# Patient Record
Sex: Female | Born: 1985 | Race: White | Hispanic: No | Marital: Single
Health system: Southern US, Community
[De-identification: ages and names within clinical notes are randomized; demographics above are authoritative.]

## PROBLEM LIST (undated history)

## (undated) ENCOUNTER — Inpatient Hospital Stay (HOSPITAL_COMMUNITY): Payer: Self-pay

---

## 2005-04-23 ENCOUNTER — Ambulatory Visit: Payer: Self-pay | Admitting: Family Medicine

## 2005-06-02 ENCOUNTER — Ambulatory Visit: Payer: Self-pay | Admitting: Family Medicine

## 2005-09-04 ENCOUNTER — Observation Stay: Payer: Self-pay | Admitting: Obstetrics and Gynecology

## 2005-09-05 ENCOUNTER — Observation Stay: Payer: Self-pay | Admitting: Certified Nurse Midwife

## 2005-10-19 ENCOUNTER — Inpatient Hospital Stay: Payer: Self-pay | Admitting: Certified Nurse Midwife

## 2010-08-30 ENCOUNTER — Ambulatory Visit: Payer: Self-pay | Admitting: Family

## 2010-10-04 ENCOUNTER — Ambulatory Visit: Payer: Self-pay | Admitting: Family

## 2010-11-25 ENCOUNTER — Ambulatory Visit: Payer: Self-pay | Admitting: Family

## 2010-12-06 ENCOUNTER — Inpatient Hospital Stay: Payer: Self-pay

## 2010-12-11 LAB — PATHOLOGY REPORT

## 2013-01-19 ENCOUNTER — Emergency Department: Payer: Self-pay

## 2013-01-19 LAB — BASIC METABOLIC PANEL
Anion Gap: 10 (ref 7–16)
BUN: 14 mg/dL (ref 7–18)
Calcium, Total: 9.2 mg/dL (ref 8.5–10.1)
Chloride: 108 mmol/L — ABNORMAL HIGH (ref 98–107)
Co2: 23 mmol/L (ref 21–32)
Glucose: 90 mg/dL (ref 65–99)
Osmolality: 281 (ref 275–301)
Potassium: 4.3 mmol/L (ref 3.5–5.1)

## 2013-01-19 LAB — CBC
HCT: 40.9 % (ref 35.0–47.0)
HGB: 13.8 g/dL (ref 12.0–16.0)
MCH: 29.8 pg (ref 26.0–34.0)
MCHC: 33.8 g/dL (ref 32.0–36.0)
Platelet: 192 10*3/uL (ref 150–440)
RBC: 4.63 10*6/uL (ref 3.80–5.20)
WBC: 12.5 10*3/uL — ABNORMAL HIGH (ref 3.6–11.0)

## 2013-01-19 LAB — URINALYSIS, COMPLETE
Bilirubin,UR: NEGATIVE
Ketone: NEGATIVE
Ph: 6 (ref 4.5–8.0)
RBC,UR: 274 /HPF (ref 0–5)
Specific Gravity: 1.015 (ref 1.003–1.030)
WBC UR: 437 /HPF (ref 0–5)

## 2013-01-21 LAB — URINE CULTURE

## 2013-02-20 ENCOUNTER — Inpatient Hospital Stay: Payer: Self-pay | Admitting: Internal Medicine

## 2013-02-20 LAB — URINALYSIS, COMPLETE
Bilirubin,UR: NEGATIVE
Glucose,UR: NEGATIVE mg/dL (ref 0–75)
Ketone: NEGATIVE
RBC,UR: 413 /HPF (ref 0–5)
Specific Gravity: 1.032 (ref 1.003–1.030)
Squamous Epithelial: 2
WBC UR: 949 /HPF (ref 0–5)

## 2013-02-20 LAB — CBC
HCT: 40.4 % (ref 35.0–47.0)
HGB: 13.7 g/dL (ref 12.0–16.0)
MCH: 29.9 pg (ref 26.0–34.0)
MCHC: 34 g/dL (ref 32.0–36.0)
Platelet: 184 10*3/uL (ref 150–440)
RBC: 4.6 10*6/uL (ref 3.80–5.20)
RDW: 13 % (ref 11.5–14.5)
WBC: 11.2 10*3/uL — ABNORMAL HIGH (ref 3.6–11.0)

## 2013-02-20 LAB — BASIC METABOLIC PANEL
Anion Gap: 9 (ref 7–16)
BUN: 15 mg/dL (ref 7–18)
Calcium, Total: 9.2 mg/dL (ref 8.5–10.1)
Chloride: 107 mmol/L (ref 98–107)
Co2: 22 mmol/L (ref 21–32)
Creatinine: 0.82 mg/dL (ref 0.60–1.30)
Glucose: 137 mg/dL — ABNORMAL HIGH (ref 65–99)
Potassium: 3.6 mmol/L (ref 3.5–5.1)
Sodium: 138 mmol/L (ref 136–145)

## 2013-02-20 LAB — HEPATIC FUNCTION PANEL A (ARMC)
Alkaline Phosphatase: 94 U/L (ref 50–136)
Bilirubin,Total: 0.5 mg/dL (ref 0.2–1.0)
SGOT(AST): 20 U/L (ref 15–37)
SGPT (ALT): 29 U/L (ref 12–78)
Total Protein: 8.8 g/dL — ABNORMAL HIGH (ref 6.4–8.2)

## 2013-02-21 LAB — CBC WITH DIFFERENTIAL/PLATELET
Basophil %: 0.6 %
Eosinophil #: 0.2 10*3/uL (ref 0.0–0.7)
HCT: 34.3 % — ABNORMAL LOW (ref 35.0–47.0)
HGB: 11.1 g/dL — ABNORMAL LOW (ref 12.0–16.0)
Lymphocyte #: 1.8 10*3/uL (ref 1.0–3.6)
MCH: 28.2 pg (ref 26.0–34.0)
MCV: 87 fL (ref 80–100)
Monocyte #: 0.9 x10 3/mm (ref 0.2–0.9)
Monocyte %: 9.7 %
RBC: 3.94 10*6/uL (ref 3.80–5.20)
RDW: 12.8 % (ref 11.5–14.5)
WBC: 9.5 10*3/uL (ref 3.6–11.0)

## 2013-02-21 LAB — BASIC METABOLIC PANEL
BUN: 11 mg/dL (ref 7–18)
Calcium, Total: 8 mg/dL — ABNORMAL LOW (ref 8.5–10.1)
Chloride: 109 mmol/L — ABNORMAL HIGH (ref 98–107)
Creatinine: 0.79 mg/dL (ref 0.60–1.30)
EGFR (Non-African Amer.): >60
Glucose: 91 mg/dL (ref 65–99)
Osmolality: 277 (ref 275–301)
Potassium: 3.4 mmol/L — ABNORMAL LOW (ref 3.5–5.1)
Sodium: 139 mmol/L (ref 136–145)

## 2013-02-22 LAB — BASIC METABOLIC PANEL
Anion Gap: 5 — ABNORMAL LOW (ref 7–16)
BUN: 8 mg/dL (ref 7–18)
Calcium, Total: 8.6 mg/dL (ref 8.5–10.1)
Creatinine: 0.65 mg/dL (ref 0.60–1.30)
EGFR (African American): >60
Glucose: 77 mg/dL (ref 65–99)
Osmolality: 275 (ref 275–301)
Sodium: 139 mmol/L (ref 136–145)

## 2013-02-26 LAB — CULTURE, BLOOD (SINGLE)

## 2013-03-15 ENCOUNTER — Ambulatory Visit: Payer: Self-pay | Admitting: Urology

## 2013-03-15 LAB — PREGNANCY, URINE: Pregnancy Test, Urine: NEGATIVE m[IU]/mL

## 2013-03-17 ENCOUNTER — Ambulatory Visit: Payer: Self-pay | Admitting: Urology

## 2013-03-31 ENCOUNTER — Ambulatory Visit: Payer: Self-pay | Admitting: Urology

## 2013-04-13 ENCOUNTER — Ambulatory Visit: Payer: Self-pay | Admitting: Urology

## 2014-01-18 ENCOUNTER — Emergency Department: Payer: Self-pay | Admitting: Emergency Medicine

## 2014-01-18 LAB — URINALYSIS, COMPLETE
Bilirubin,UR: NEGATIVE
GLUCOSE, UR: NEGATIVE mg/dL (ref 0–75)
Ketone: NEGATIVE
NITRITE: POSITIVE
PH: 6 (ref 4.5–8.0)
PROTEIN: NEGATIVE
RBC,UR: 4 /HPF (ref 0–5)
Specific Gravity: 1.016 (ref 1.003–1.030)
Squamous Epithelial: 2
WBC UR: 32 /HPF (ref 0–5)

## 2014-01-19 LAB — COMPREHENSIVE METABOLIC PANEL
ALBUMIN: 3.9 g/dL (ref 3.4–5.0)
Alkaline Phosphatase: 60 U/L
Anion Gap: 7 (ref 7–16)
BUN: 8 mg/dL (ref 7–18)
Bilirubin,Total: 0.2 mg/dL (ref 0.2–1.0)
CO2: 26 mmol/L (ref 21–32)
Calcium, Total: 9.3 mg/dL (ref 8.5–10.1)
Chloride: 106 mmol/L (ref 98–107)
Creatinine: 0.76 mg/dL (ref 0.60–1.30)
EGFR (African American): >60
EGFR (Non-African Amer.): >60
Glucose: 87 mg/dL (ref 65–99)
Osmolality: 275 (ref 275–301)
Potassium: 3.7 mmol/L (ref 3.5–5.1)
SGOT(AST): 17 U/L (ref 15–37)
SGPT (ALT): 22 U/L (ref 12–78)
SODIUM: 139 mmol/L (ref 136–145)
Total Protein: 7.4 g/dL (ref 6.4–8.2)

## 2014-01-19 LAB — CBC WITH DIFFERENTIAL/PLATELET
BASOS PCT: 0.6 %
Basophil #: 0 10*3/uL (ref 0.0–0.1)
EOS PCT: 5.6 %
Eosinophil #: 0.4 10*3/uL (ref 0.0–0.7)
HCT: 41.1 % (ref 35.0–47.0)
HGB: 13.7 g/dL (ref 12.0–16.0)
LYMPHS ABS: 3.6 10*3/uL (ref 1.0–3.6)
LYMPHS PCT: 46 %
MCH: 31.1 pg (ref 26.0–34.0)
MCHC: 33.4 g/dL (ref 32.0–36.0)
MCV: 93 fL (ref 80–100)
Monocyte #: 0.6 x10 3/mm (ref 0.2–0.9)
Monocyte %: 8.1 %
NEUTROS ABS: 3.1 10*3/uL (ref 1.4–6.5)
NEUTROS PCT: 39.7 %
Platelet: 159 10*3/uL (ref 150–440)
RBC: 4.42 10*6/uL (ref 3.80–5.20)
RDW: 12.8 % (ref 11.5–14.5)
WBC: 7.8 10*3/uL (ref 3.6–11.0)

## 2014-02-16 IMAGING — CT CT STONE STUDY
1 of 2 series · 15 of 32 positions shown, 19 images · non-contrast
Comparison: none

REASON FOR EXAM: rt flank pain, UTI, hx of stones
COMMENTS:

PROCEDURE:     CT  - CT ABDOMEN /PELVIS WO (STONE)  - February 20, 2013 [DATE]
RESULT:     History: Right flank pain.
Comparison Study: Renal ultrasound 01/19/2013.

[Series 2: 3mm soft tissue · axial · 0.73mm/px · z∈[-989,-587]mm · 15 of 148 slices shown, 19 images]
[im 7/148  soft-tissue]
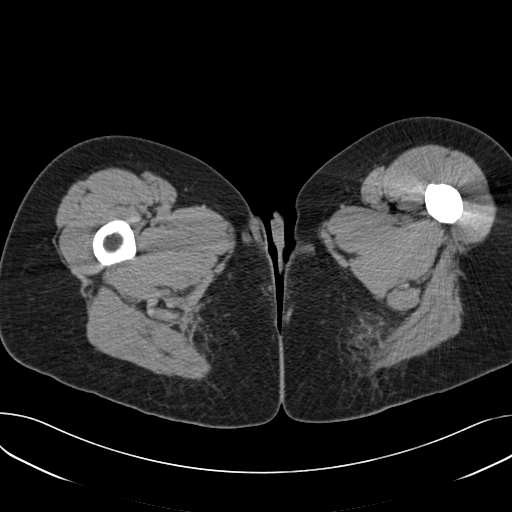
[im 7/148  bone]
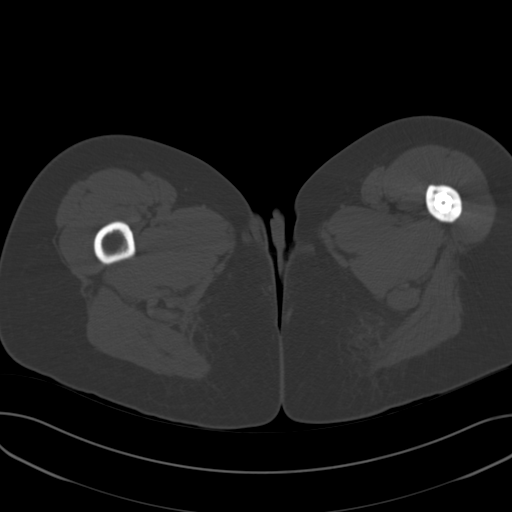
[im 19/148  soft-tissue]
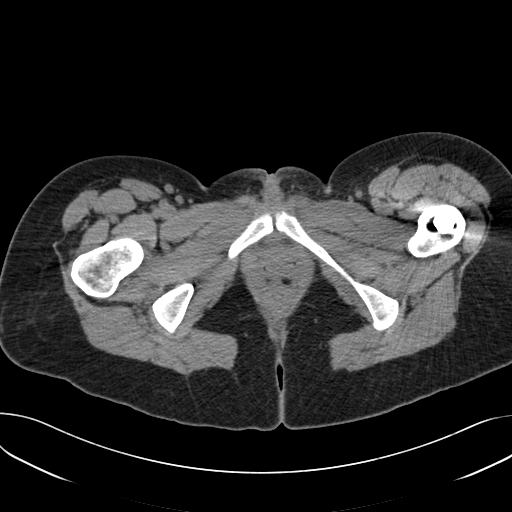
[im 31/148  soft-tissue]
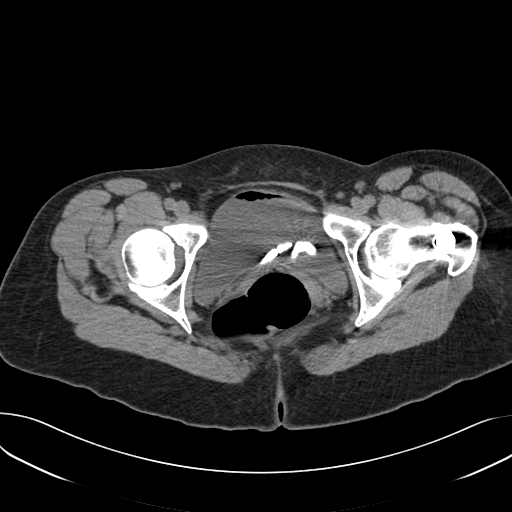
[im 43/148  soft-tissue]
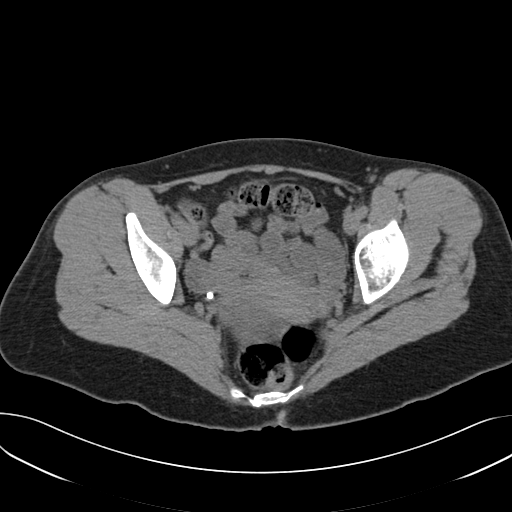
[im 50/148  soft-tissue]
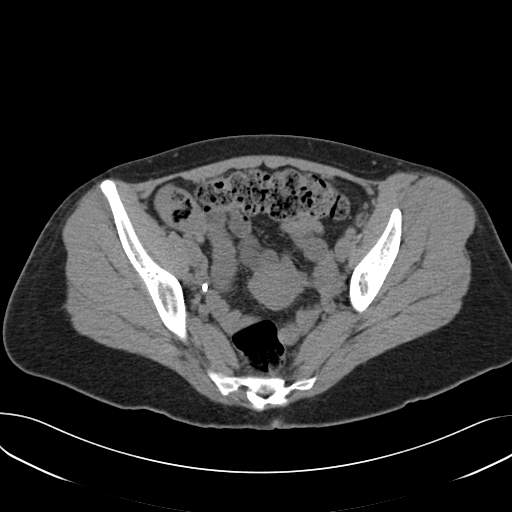
[im 62/148  soft-tissue]
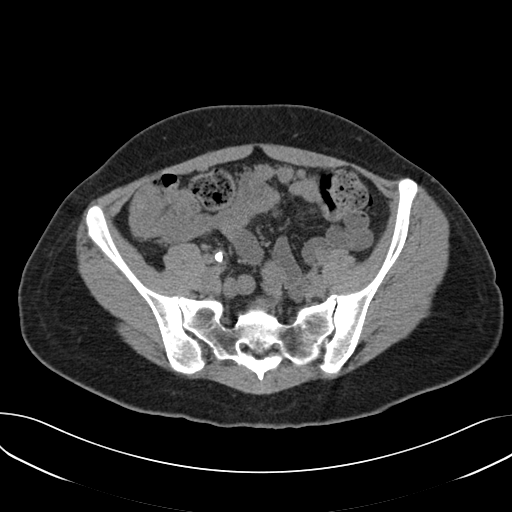
[im 74/148  soft-tissue]
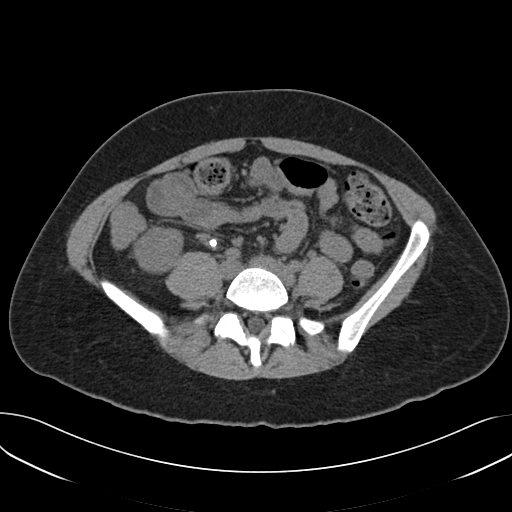
[im 86/148  soft-tissue]
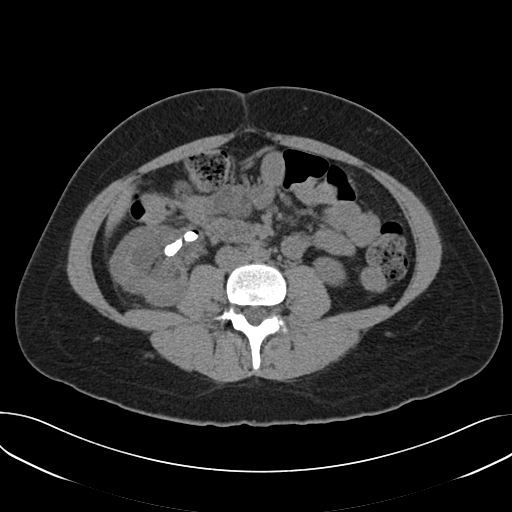
[im 99/148  soft-tissue]
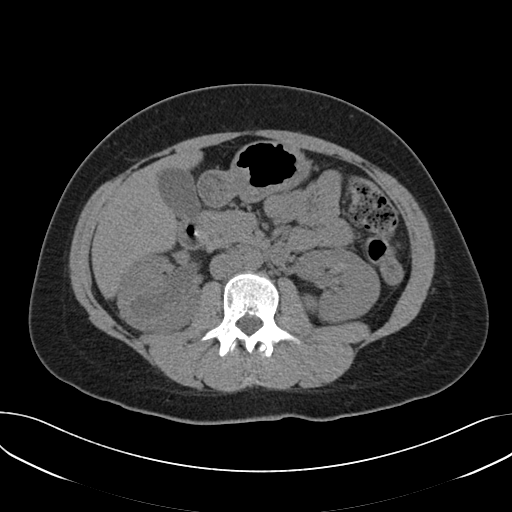
[im 99/148  bone]
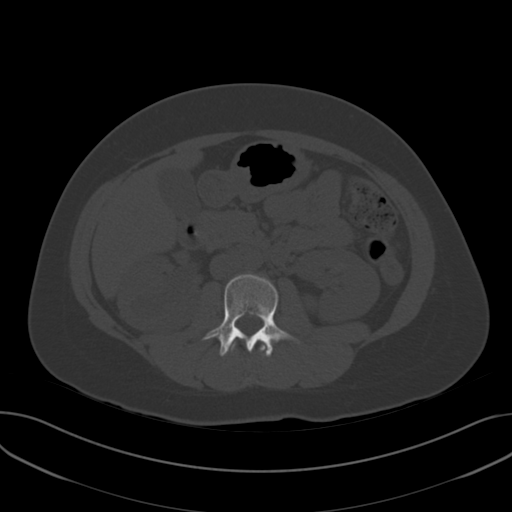
[im 105/148  soft-tissue]
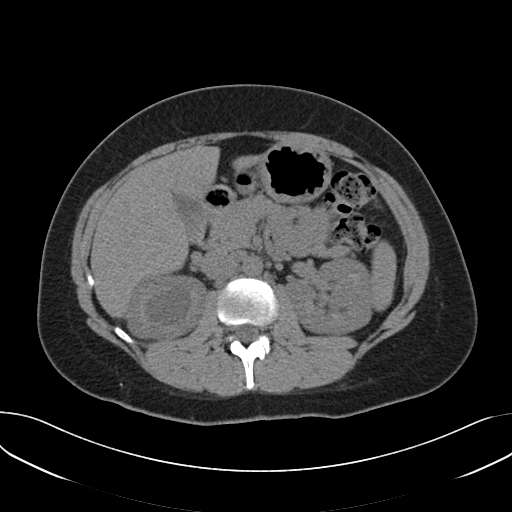
[im 117/148  soft-tissue]
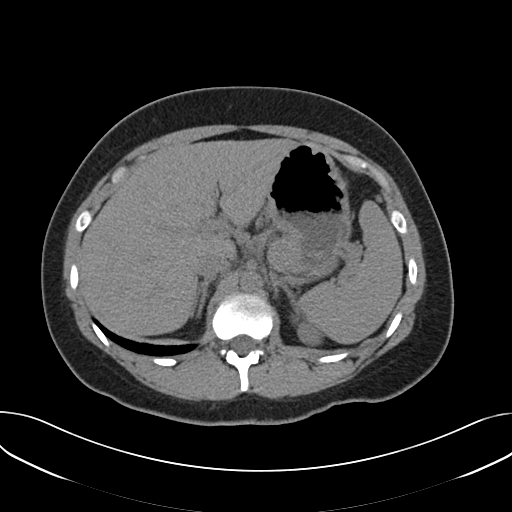
[im 123/148  lung]
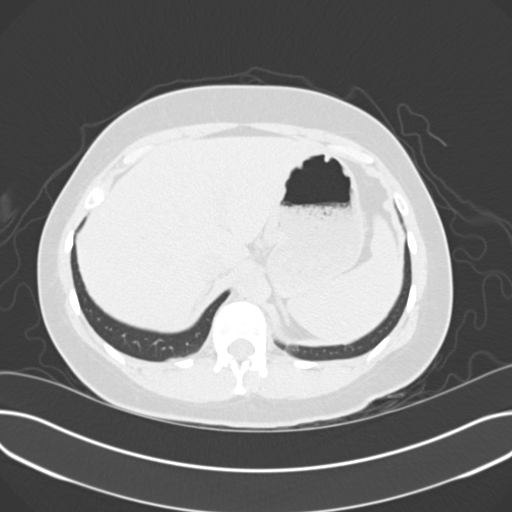
[im 129/148  soft-tissue]
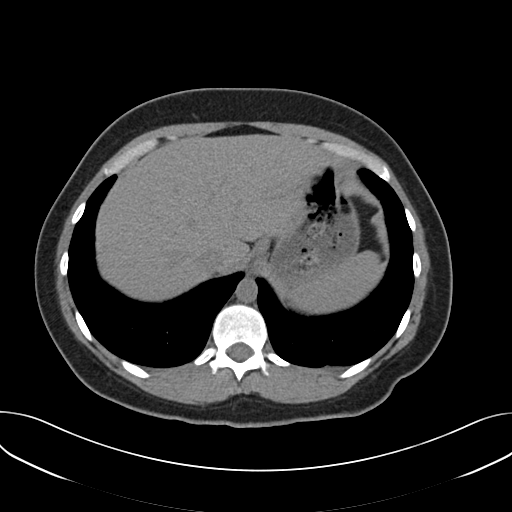
[im 129/148  lung]
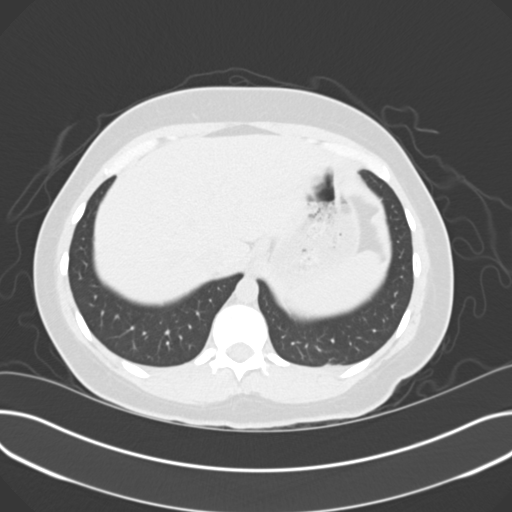
[im 135/148  lung]
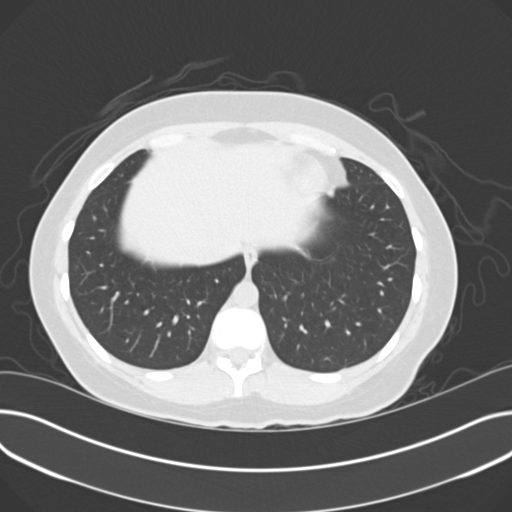
[im 141/148  soft-tissue]
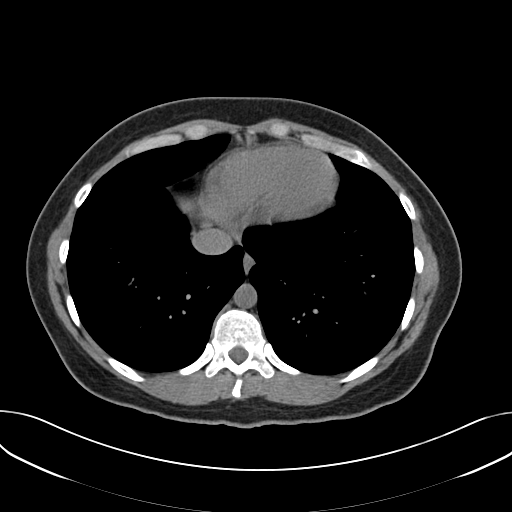
[im 141/148  lung]
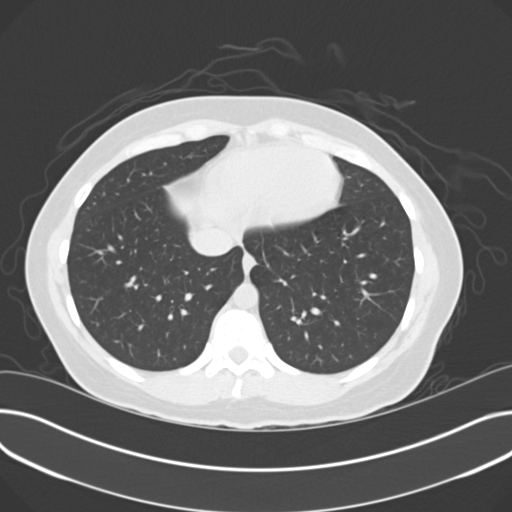

[15 of 32 positions shown; findings below may reference images not displayed]

FINDINGS: Standard nonenhanced CT obtained. Liver normal. Spleen normal.
Gallbladder and biliary system nondistended. Pancreas normal

Adrenals normal. Right ureteral stent with right hydronephrosis and
nephrolithiasis noted. Stent is in good anatomic position. Air is noted
within the renal collecting system. This could be from instrumentation or
gas-forming organism. Left kidney and left renal collecting system are
unremarkable. No evidence of obstructing ureteral stone on the left. Bladder
is nondistended and contains air. This could be from instrumentation or
gas-forming organism. Uterus and adnexa are unremarkable.

No significant adenopathy. Abdominal aorta normal in caliber.

Appendix is normal. No bowel distention. Stool in colon. No free air.
Esophagogastric and gastroduodenal regions are normal.

Lung bases are clear. Heart size is normal. No acute bony abnormality. Prior
left hip surgery.
IMPRESSION: Right ureteral stent with right hydronephrosis and
nephrolithiasis. There is no the right renal collecting system and bladder
pre this may be from instrumentation. Gas-forming organism UTI could also
present this fashion.

## 2014-11-17 NOTE — Consult Note (Signed)
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 Lenard LancePaduchowski in regards to this patient.  She is a 29 year old female with history of nephrolithiasis and indwelling stent for 1-2 years.  She has not followed up with her urologist.  She presents with dysuria.  No fever.  Labs reveal a postive UA and slightly elevated WBC of 11.2.  The patient is afebrile.  CT scan concerning for gas within the collecting system.  I reviewed the CT scan and there is NO gas within the renal parenchyma or right perinephric tissues- no evidence of emphysematous pyelonephritis.  Gas within the collecting system can be treated like pyelonephritis with antibiotics.  Since this patient has had her stent in for possibly>12 months, this needs to be addressed.  She has residual stone in the right kidney on CT scan.  There may be role for removal of stent or treatment of stone after the patient has initiated treatment for pyelonephritis.  There is no urgent need for surgical intervention.  This case will be discussed with Dr. Lonna CobbStoioff, who will be on call for Urology on Monday, July 28.  Electronic Signatures: Janit BernMcNamara, Lanell Dubie R (MD)  (Signed on 27-Jul-14 23:19)  Authored  Last Updated: 27-Jul-14 23:19 by Janit BernMcNamara, Ercell Razon R (MD)
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
## 2014-11-17 NOTE — Discharge Summary (Signed)
PATIENT NAME:  Kristy Perkins, Kristy MR#:  [id number] DATE OF BIRTH:  Oct 19, 1985  DATE OF ADMISSION:  02/20/2013 DATE OF DISCHARGE:  02/22/2013  DISCHARGE DIAGNOSES:  1.  Acute pyelonephritis, renal stones, and stent in the ureter in the past.  2.  Flank pain.   CONDITION ON DISCHARGE: Stable.   CODE STATUS ON DISCHARGE: FULL CODE.   MEDICATIONS ON DISCHARGE: 1.  Ceftin 500 mg oral tablet, 2 times a day for 5 days.  2. Percocet 5/325 mg oral tablet every 8 hours for 4 days as needed for pain.   DIET ON DISCHARGE: Regular.   DIET CONSISTENCY: Regular.   ACTIVITY: As tolerated.   TIMEFRAME TO FOLLOW UP: Within 1 to 2 weeks in urology clinic, and advised to follow in urology clinic with Dr. Irineo AxonScott Stoioff, [facility], [place].    HISTORY OF PRESENT ILLNESS: A 29 year old female with multiple stones in the right ureter, required right percutaneous nephrostomy tube, followed by stent placement in the ureter in [place], which was 1 year ago and was supposed to follow in January 2014 for stent removal, but she did not follow because of financial reasons and the patient moved over here to live with her father. Started having severe pain and discolored urine with with fever for the last  1 to 2 days, and so decided to come to the Emergency Room. On work-up in ER, she was found having a urinalysis positive for infection, having more than 1000 WBCs and 3+ bacteria, and a CT scan also confirmed finding of having pyelonephritis, so she was admitted for further management for that. She was started on IV antibiotics, IV Zosyn, and a urology consult was called in. Urologist,  Dr. Lonna CobbStoioff, saw her and said that she needs to follow with him in clinic. He does not want to do any procedures acutely as she is undergoing infection and she was advised with that and switched to oral antibiotic, with which she was doing nicely and sent home.   OTHER MEDICAL ISSUES: Her pain was managed by morphine.   IMPORTANT  LABORATORY RESULTS IN THIS HOSPITAL STAY:  Urinalysis was cloudy, 413 RBC's and 949 WBC's, 3+ bacteria, 3+ leukocyte esterase.   Urine culture was mixed bacterial organism and suggestive of contamination. BUN 15, creatinine 0.82, sodium 138, potassium 3.6, glucose 137.  CT of the abdomen without contrast for stone showed right ureteral stent with right hydronephrosis and nephrolithiasis.  Gas-forming organism urinary tract infection also present in this fashion.   Blood cultures remained negative. Her creatinine was 0.65 at the time of discharge.   Total time spent on this discharge: Thirty-five minutes.    ____________________________ Hope PigeonVaibhavkumar G. Elisabeth PigeonVachhani, MD vgv:dm D: 02/24/2013 14:40:40 ET T: 02/24/2013 15:12:25 ET JOB#: [id number]  cc: Hope PigeonVaibhavkumar G. Elisabeth PigeonVachhani, MD, <Dictator> Scott C. Lonna CobbStoioff, MD Altamese DillingVAIBHAVKUMAR Elverna Caffee MD ELECTRONICALLY SIGNED 03/01/2013 23:19

## 2014-11-17 NOTE — Op Note (Signed)
PATIENT NAME:  Kristy Perkins, Kristy Perkins MR#:  [id number] DATE OF BIRTH:  [birth year]  DATE OF PROCEDURE:  04/13/2013  PREOPERATIVE DIAGNOSIS:  Retained right ureteral stent.   POSTOPERATIVE DIAGNOSIS:  Retained right ureteral stent.  PROCEDURE:  1.  Cystoscopy with laser lithotripsy of encrusted right ureteral stent.  2.  Stent removal.   SURGEON:  Bettejane Leavens C. Lonna CobbStoioff, M.D.   ASSISTANT:  None.   ANESTHESIA:  General.   INDICATIONS:  This is a 29 year old female with a history of right renal pelvic stone treated in [place] approximately one year ago with PCNL.  She had a right ureteral stent placed at the time of the procedure, never had it removed.  She was recently admitted with UTI and found to have an encrusted ureteral stent.  The proximal portion of the stent was heavily encrusted and she underwent lithotripsy with good fragmentation.  Attempt at stent removal in the office were unsuccessful due to heavy encrustation of the distal portion of the stent.  There did not appear to be any significant encrustation in the ureter.   DESCRIPTION OF PROCEDURE:  She was taken to the cystoscopy suite and placed in a supine position.  A general anesthetic was administered via an LMA.  She was then placed in the lithotomy position and her external genitalia were prepped and draped in the usual fashion.  A timeout was performed per protocol with all in agreement.  A 21 French cystoscope with obturator lubricated and passed per urethra.  The stent was heavily encrusted.  A 365 micron holmium laser fiber was placed through the cystoscope.  The encrustation was easily fragmented at a power of 10 watts.  Once the majority of the fragments had been removed the stent was able to be uncurled.  It was grasped with endoscopic forceps and brought out through the meatus.  The stent was slowly withdrawn under fluoroscopic guidance.  There was good uncurling of the proximal portion of the stent.  The stent was removed without  difficulty.  The cystoscope was reinserted.  The majority of the fragments were irrigated from the bladder.  Some of the larger fragments were removed with endoscopic forceps.  At the completion of the procedure, the right ureteral orifice was normal in appearance without bleeding.  There was no significant bleeding from the urethra.  There were some fine particles remaining in the bladder which should pass.  The bladder was emptied and the cystoscope was removed.  A B and O suppository was placed per rectum.  She was taken to PACU in stable condition.  There were no complications.  EBL was minimal.    ____________________________ Verna CzechScott C. Lonna CobbStoioff, MD scs:ea D: 04/13/2013 17:24:01 ET T: 04/14/2013 00:14:13 ET JOB#: [id number]  cc: Lorin PicketScott C. Lonna CobbStoioff, MD, <Dictator> Riki AltesSCOTT C Rhen Kawecki MD ELECTRONICALLY SIGNED 04/20/2013 10:17

## 2015-01-15 IMAGING — US US RENAL KIDNEY
1 series · 14 of 25 positions shown · non-contrast
Comparison: 02/20/2013

CLINICAL DATA: Flank pain. Prior double-J right ureteral stent
removed in April 2013

EXAM:
RENAL/URINARY TRACT ULTRASOUND COMPLETE

[Series 1: us renal kidney · 0.18mm/px · 14 of 31 slices shown]
[im 1/31]
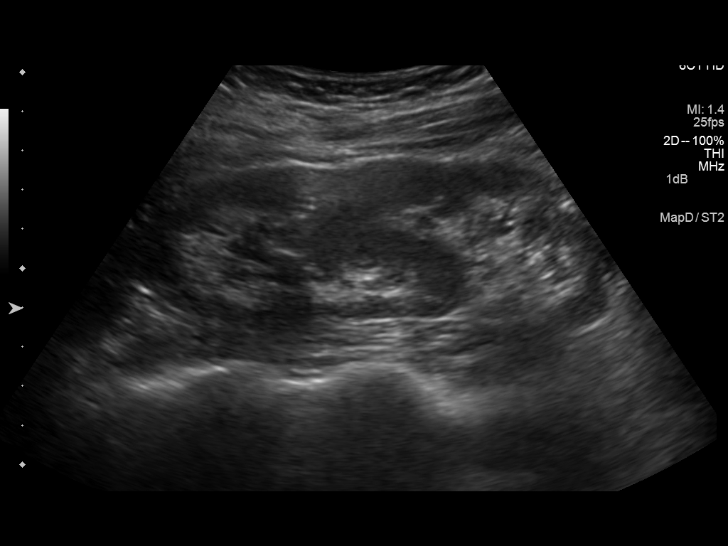
[im 3/31]
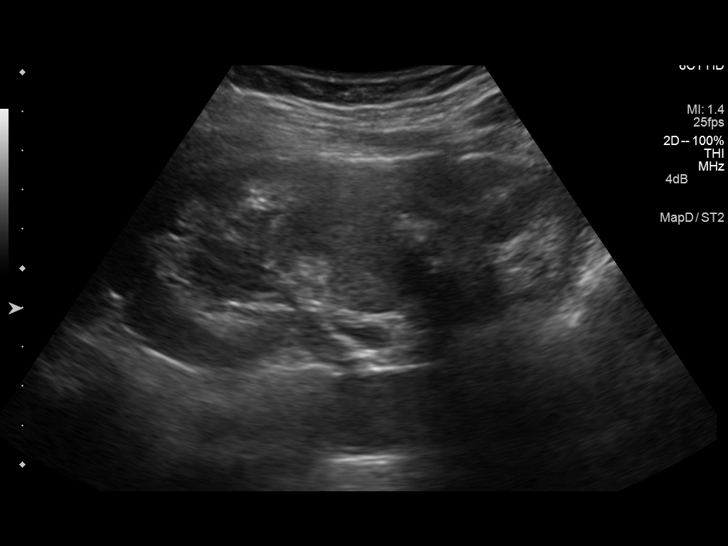
[im 6/31]
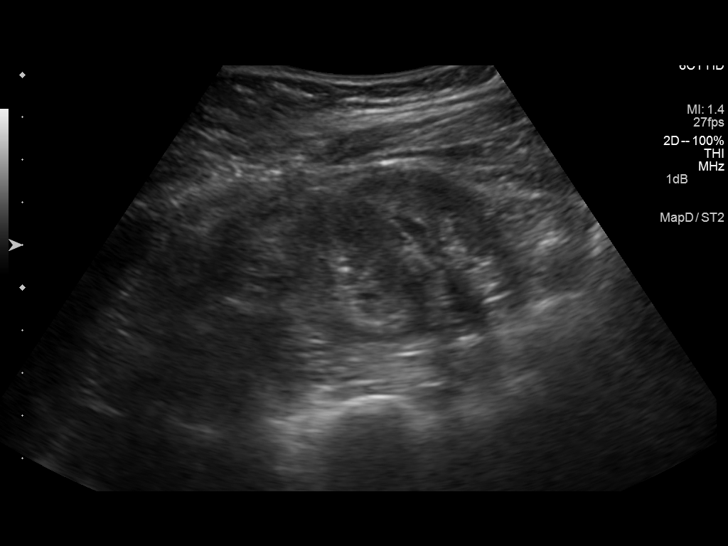
[im 8/31]
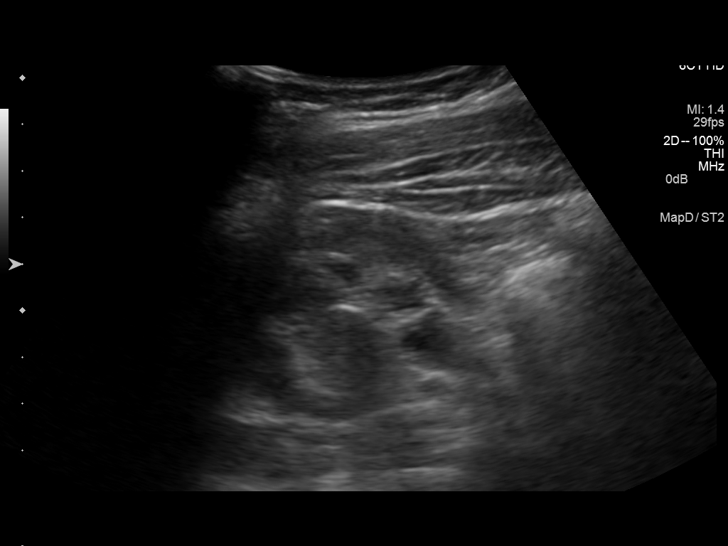
[im 11/31]
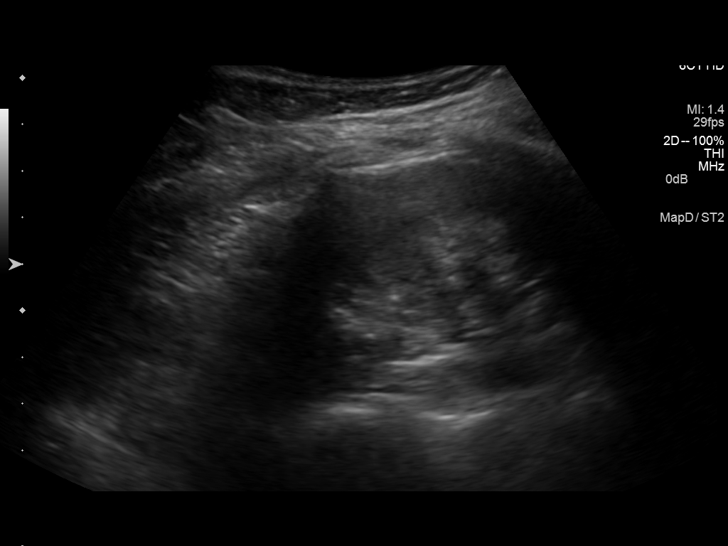
[im 12/31]
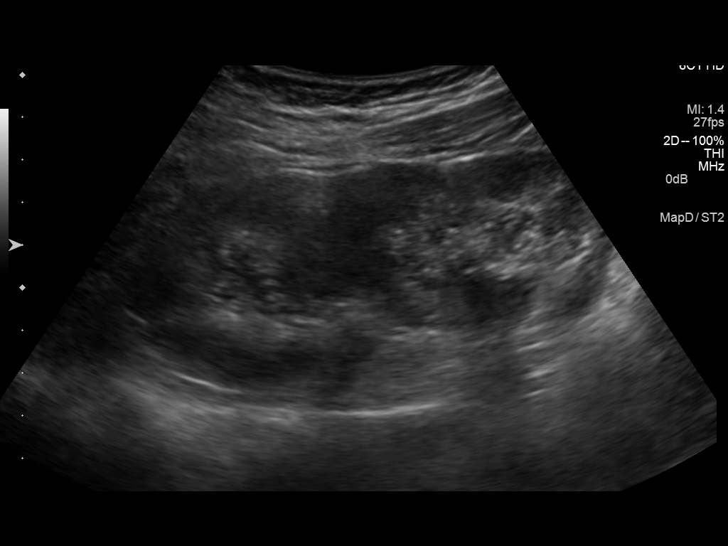
[im 14/31]
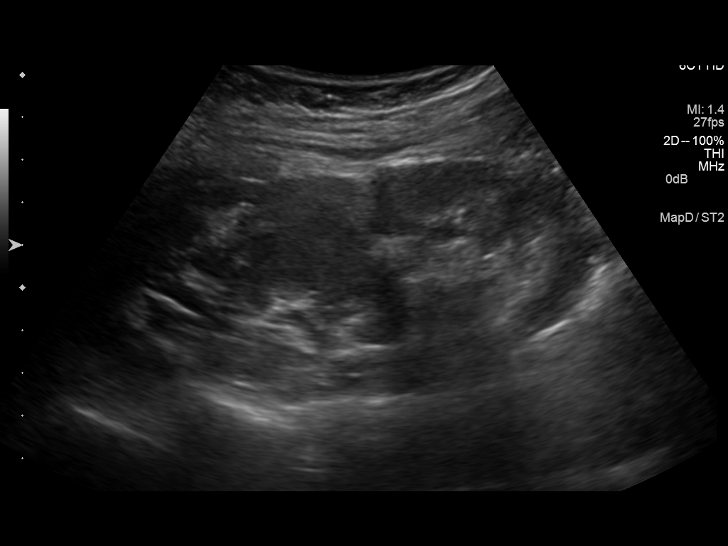
[im 17/31]
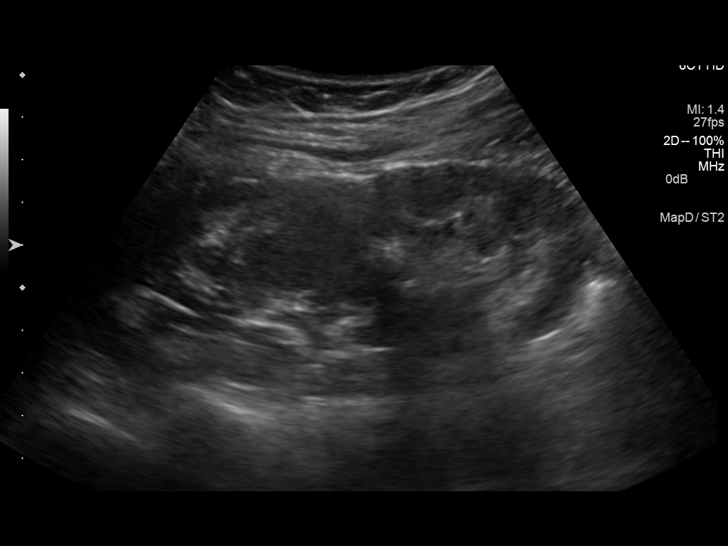
[im 19/31]
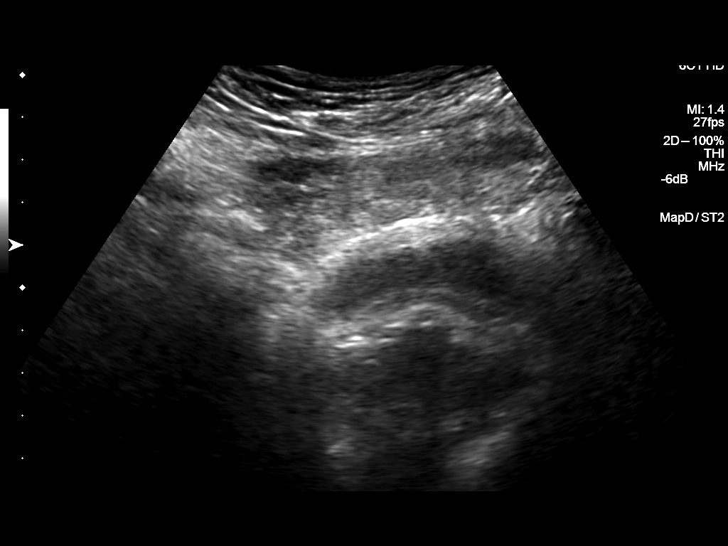
[im 21/31]
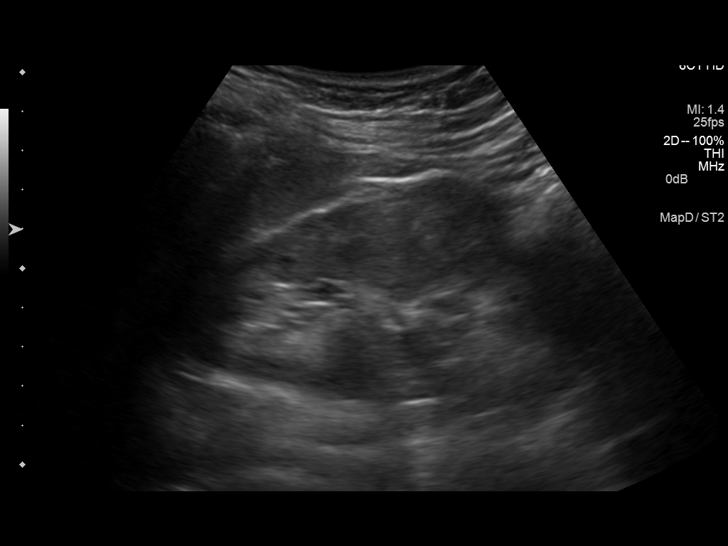
[im 23/31]
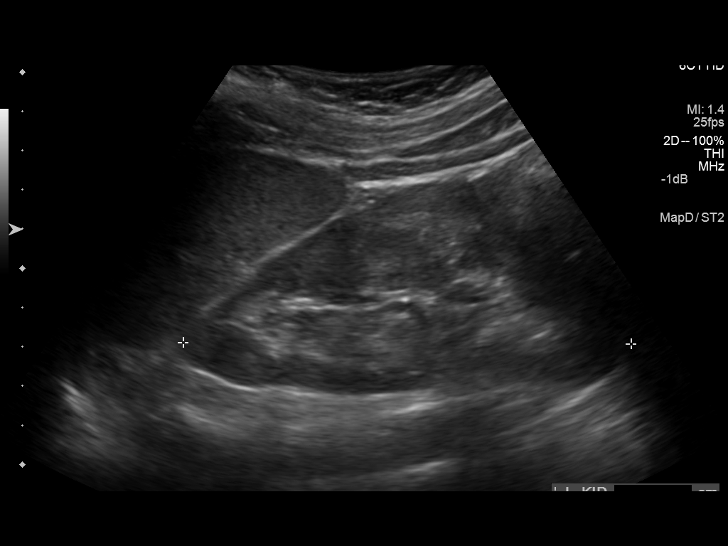
[im 26/31]
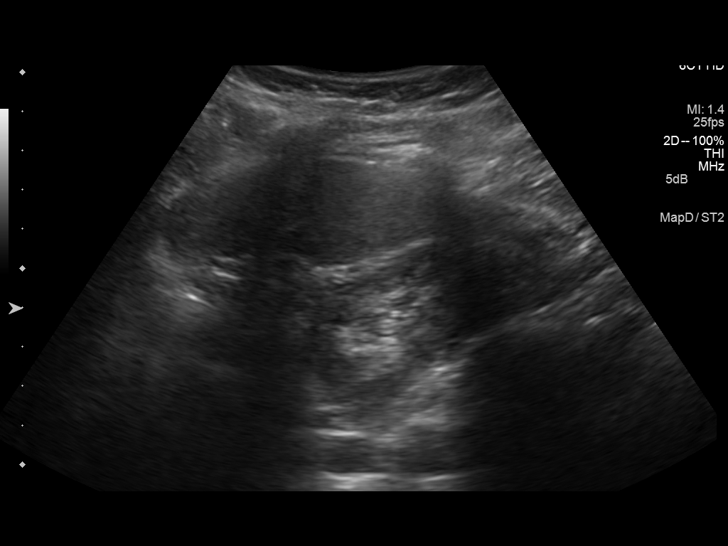
[im 28/31]
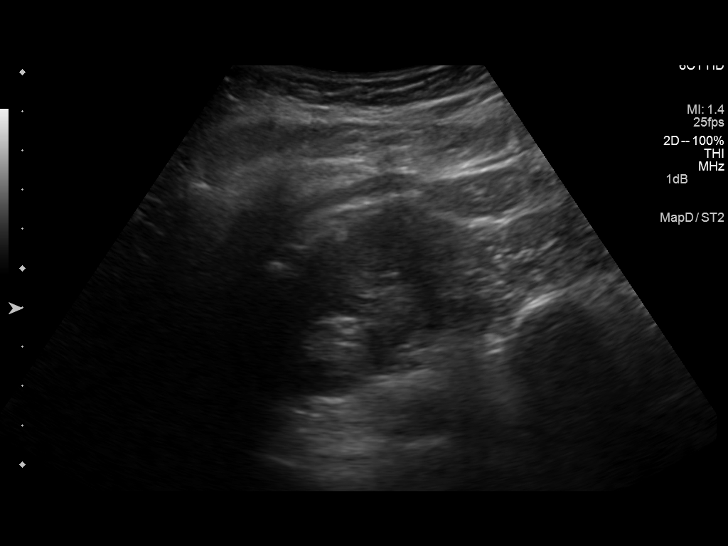
[im 31/31]
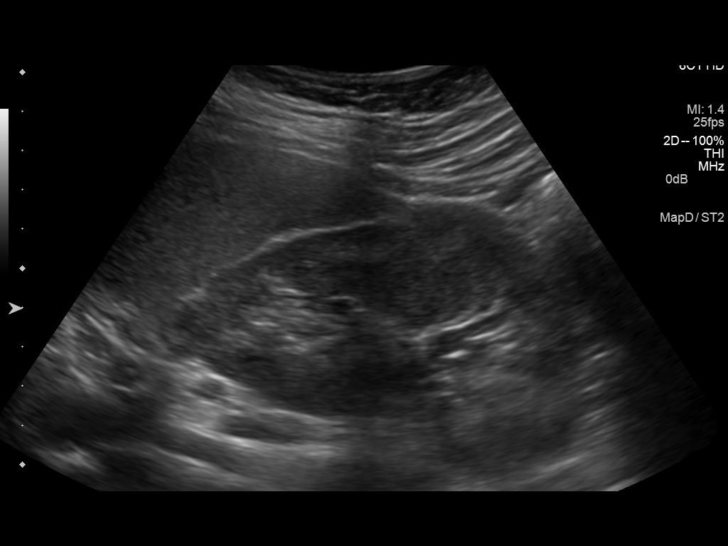

[14 of 25 positions shown; findings below may reference images not displayed]

FINDINGS: Right Kidney:

Length: 11.6 cm. Mid to lower kidney calcification or cluster of
calcifications med during up to 1 cm with associated shadowing. Mild
fullness of the right collecting system.

Left Kidney:

Length: 11.4 cm. Echogenicity within normal limits. No mass or
hydronephrosis visualized.

Bladder:

Empty during imaging.
IMPRESSION: 1. Right mid to lower kidney calculus or cluster of calculi
measuring up to approximately 1 cm. Suspected mild prominence of the
lower pole collecting system on the right.
